# Patient Record
Sex: Female | Born: 2000
Health system: Southern US, Community
[De-identification: ages and names within clinical notes are randomized; demographics above are authoritative.]

## PROBLEM LIST (undated history)

## (undated) HISTORY — PX: NO PAST SURGERIES: SHX2092

---
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
## 2022-10-15 ENCOUNTER — Other Ambulatory Visit: Payer: Self-pay | Admitting: Family

## 2022-10-15 DIAGNOSIS — N6321 Unspecified lump in the left breast, upper outer quadrant: Secondary | ICD-10-CM
